# Patient Record
Sex: Female | Born: 1955 | Race: White | Hispanic: No | Marital: Married | Smoking: Never smoker
Health system: Southern US, Community
[De-identification: ages and names within clinical notes are randomized; demographics above are authoritative.]

## PROBLEM LIST (undated history)

## (undated) DIAGNOSIS — C801 Malignant (primary) neoplasm, unspecified: Secondary | ICD-10-CM

## (undated) DIAGNOSIS — Z9889 Other specified postprocedural states: Secondary | ICD-10-CM

## (undated) DIAGNOSIS — E039 Hypothyroidism, unspecified: Secondary | ICD-10-CM

## (undated) HISTORY — PX: BREAST EXCISIONAL BIOPSY: SUR124

## (undated) HISTORY — PX: BUNIONECTOMY: SHX129

---

## 1999-06-19 ENCOUNTER — Other Ambulatory Visit: Admission: RE | Admit: 1999-06-19 | Discharge: 1999-06-19 | Payer: Self-pay | Admitting: Obstetrics and Gynecology

## 1999-12-16 ENCOUNTER — Encounter: Payer: Self-pay | Admitting: Gastroenterology

## 1999-12-16 ENCOUNTER — Encounter: Admission: RE | Admit: 1999-12-16 | Discharge: 1999-12-16 | Payer: Self-pay | Admitting: Gastroenterology

## 2000-08-06 ENCOUNTER — Other Ambulatory Visit: Admission: RE | Admit: 2000-08-06 | Discharge: 2000-08-06 | Payer: Self-pay | Admitting: *Deleted

## 2000-12-24 ENCOUNTER — Encounter: Admission: RE | Admit: 2000-12-24 | Discharge: 2000-12-24 | Payer: Self-pay | Admitting: Gastroenterology

## 2000-12-24 ENCOUNTER — Encounter: Payer: Self-pay | Admitting: Gastroenterology

## 2001-08-19 ENCOUNTER — Other Ambulatory Visit: Admission: RE | Admit: 2001-08-19 | Discharge: 2001-08-19 | Payer: Self-pay | Admitting: Obstetrics and Gynecology

## 2002-01-27 ENCOUNTER — Encounter: Admission: RE | Admit: 2002-01-27 | Discharge: 2002-01-27 | Payer: Self-pay | Admitting: Gastroenterology

## 2002-01-27 ENCOUNTER — Encounter: Payer: Self-pay | Admitting: Gastroenterology

## 2002-10-04 ENCOUNTER — Other Ambulatory Visit: Admission: RE | Admit: 2002-10-04 | Discharge: 2002-10-04 | Payer: Self-pay | Admitting: Obstetrics and Gynecology

## 2003-01-29 ENCOUNTER — Encounter: Admission: RE | Admit: 2003-01-29 | Discharge: 2003-01-29 | Payer: Self-pay | Admitting: Gastroenterology

## 2003-01-29 ENCOUNTER — Encounter: Payer: Self-pay | Admitting: Gastroenterology

## 2003-10-09 ENCOUNTER — Other Ambulatory Visit: Admission: RE | Admit: 2003-10-09 | Discharge: 2003-10-09 | Payer: Self-pay | Admitting: Obstetrics and Gynecology

## 2004-02-12 ENCOUNTER — Encounter: Admission: RE | Admit: 2004-02-12 | Discharge: 2004-02-12 | Payer: Self-pay | Admitting: Gastroenterology

## 2005-03-17 ENCOUNTER — Encounter: Admission: RE | Admit: 2005-03-17 | Discharge: 2005-03-17 | Payer: Self-pay | Admitting: Obstetrics and Gynecology

## 2006-03-25 ENCOUNTER — Encounter: Admission: RE | Admit: 2006-03-25 | Discharge: 2006-03-25 | Payer: Self-pay | Admitting: Obstetrics and Gynecology

## 2007-04-19 ENCOUNTER — Encounter: Admission: RE | Admit: 2007-04-19 | Discharge: 2007-04-19 | Payer: Self-pay | Admitting: Obstetrics and Gynecology

## 2008-04-26 ENCOUNTER — Encounter: Admission: RE | Admit: 2008-04-26 | Discharge: 2008-04-26 | Payer: Self-pay | Admitting: Internal Medicine

## 2009-05-14 ENCOUNTER — Encounter: Admission: RE | Admit: 2009-05-14 | Discharge: 2009-05-14 | Payer: Self-pay | Admitting: Obstetrics and Gynecology

## 2010-06-24 ENCOUNTER — Encounter: Admission: RE | Admit: 2010-06-24 | Discharge: 2010-06-24 | Payer: Self-pay | Admitting: Obstetrics and Gynecology

## 2011-06-17 ENCOUNTER — Other Ambulatory Visit: Payer: Self-pay | Admitting: Obstetrics and Gynecology

## 2011-06-17 DIAGNOSIS — Z1231 Encounter for screening mammogram for malignant neoplasm of breast: Secondary | ICD-10-CM

## 2011-06-29 ENCOUNTER — Ambulatory Visit
Admission: RE | Admit: 2011-06-29 | Discharge: 2011-06-29 | Disposition: A | Discharge status: Home / Self Care | Payer: 59 | Source: Ambulatory Visit | Attending: Obstetrics and Gynecology | Admitting: Obstetrics and Gynecology

## 2011-06-29 DIAGNOSIS — Z1231 Encounter for screening mammogram for malignant neoplasm of breast: Secondary | ICD-10-CM

## 2011-11-25 ENCOUNTER — Ambulatory Visit (INDEPENDENT_AMBULATORY_CARE_PROVIDER_SITE_OTHER): Payer: 59 | Admitting: Sports Medicine

## 2011-11-25 DIAGNOSIS — M76829 Posterior tibial tendinitis, unspecified leg: Secondary | ICD-10-CM | POA: Insufficient documentation

## 2011-11-25 DIAGNOSIS — Q667 Congenital pes cavus, unspecified foot: Secondary | ICD-10-CM | POA: Insufficient documentation

## 2011-11-25 NOTE — Patient Instructions (Signed)
Great to see you. Continue physical therapy. Orthotics. Come back to see [place] in 6 weeks.

## 2011-11-25 NOTE — Assessment & Plan Note (Signed)
Continue physical therapy. Continue topical Lidoderm patches. Orthotics as above. Return to see [place] in 6 weeks.

## 2011-11-25 NOTE — Progress Notes (Signed)
  Subjective:    Patient ID: Karla Walker, female    DOB: [birth year], 56 y.o.   MRN: [id number]  HPI Karla Walker comes in with pain in her left ankle since summer of 2012. She has a diagnosis of posterior tibialis tendinitis. She has been through several sessions of formal physical therapy, and is about 20% improved. She plays a lot of tennis, and notes that her shoes are fairly unstable. She is desiring orthotics.  Past medical history: Hypothyroidism, bilateral metatarsal stress fractures. Medications: Synthroid. Past surgical history: Right foot bunionectomy. Family history: Positive for diabetes, hypertension, heart disease. Social history: Denies use of tobacco or drugs, uses alcohol occasionally. Allergies: None.  Review of Systems    No fevers, chills, night sweats, weight loss, chest pain, or shortness of breath.  Social History: Non-smoker. Objective:   Physical Exam General:  Well developed, well nourished, and in no acute distress. Neuro:  Alert and oriented x3, extra-ocular muscles intact. Skin: Warm and dry, no rashes noted. Respiratory:  Not using accessory muscles, speaking in full sentences. Musculoskeletal: Ankle: No visible erythema or swelling. Range of motion is full in all directions. Strength is 5/5 in all directions. Stable lateral and medial ligaments; squeeze test and kleiger test unremarkable; Talar dome nontender; No pain at base of 5th MT; No tenderness over cuboid; No tenderness over N spot or navicular prominence No tenderness on posterior aspects of lateral and medial malleolus No sign of peroneal tendon subluxations or tenderness to palpation Negative tarsal tunnel tinel's Able to walk 4 steps.  Pes planus, hallux valgus with bunion, bunionette, no abnormal callus. First ray insufficiency bilaterally.  Patient was fitted for a standard, cushioned, semi-rigid orthotic. The orthotic was heated and afterward the patient stood on the orthotic blank  positioned on the orthotic stand. The patient was positioned in subtalar neutral position and 10 degrees of ankle dorsiflexion in a weight bearing stance. After completion of molding, a stable base was applied to the orthotic blank. The blank was ground to a stable position for weight bearing. Size: 8 Base: Blue EVA Posting and Padding: None  The patient ambulated and was comfortable.   Assessment & Plan:

## 2011-11-25 NOTE — Assessment & Plan Note (Signed)
Orthotics as above. 

## 2012-01-06 ENCOUNTER — Ambulatory Visit (INDEPENDENT_AMBULATORY_CARE_PROVIDER_SITE_OTHER): Payer: 59 | Admitting: Sports Medicine

## 2012-01-06 VITALS — BP 138/90

## 2012-01-06 DIAGNOSIS — M76829 Posterior tibial tendinitis, unspecified leg: Secondary | ICD-10-CM

## 2012-01-06 NOTE — Progress Notes (Signed)
  Subjective:    Patient ID: Karla Walker, female    DOB: Mar 08, 1956, 56 y.o.   MRN: [id number]  HPI Karla Walker comes back for followup of her tibialis posterior tendinopathy in her left ankle.she's been through 6 weeks of physical therapy, and we made her some orthotics. Overall she was significantly better, however she has been out of her orthotics for a short period of time. The pain is starting to return, and she would like another pair orthotics.   Review of Systems    No fevers, chills, night sweats, weight loss, chest pain, or shortness of breath.  Social History: Non-smoker. Objective:   Physical Exam General:  Well developed, well nourished, and in no acute distress. Neuro:  Alert and oriented x3, extra-ocular muscles intact. Skin: Warm and dry, no rashes noted. Respiratory:  Not using accessory muscles, speaking in full sentences. Musculoskeletal: Left Ankle: No visible erythema or swelling. Range of motion is full in all directions. Strength is 5/5 in all directions. Stable lateral and medial ligaments; squeeze test and kleiger test unremarkable; Talar dome nontender; No pain at base of 5th MT; No tenderness over cuboid; No tenderness over N spot or navicular prominence No tenderness on posterior aspects of lateral and medial malleolus No sign of peroneal tendon subluxations or tenderness to palpation Negative tarsal tunnel tinel's Able to walk 4 steps.  Pes planus, hallux valgus with bunion, bunionette, no abnormal callus. First ray insufficiency bilaterally.  Patient was fitted for a standard, cushioned, semi-rigid orthotic. The orthotic was heated and afterward the patient stood on the orthotic blank positioned on the orthotic stand. The patient was positioned in subtalar neutral position and 10 degrees of ankle dorsiflexion in a weight bearing stance. After completion of molding, a stable base was applied to the orthotic blank. The blank was ground to a stable position  for weight bearing. Size: 8 Base: Blue EVA Posting and Padding: None  The patient ambulated and was comfortable.      Assessment & Plan:

## 2012-01-06 NOTE — Assessment & Plan Note (Signed)
Orthotics as above. Home rehabilitation exercises. Also provided her with several scaphoid pads to use in her flats. We will see her back in about 4-6 weeks to see her she's doing.

## 2012-06-10 ENCOUNTER — Other Ambulatory Visit: Payer: Self-pay | Admitting: Obstetrics and Gynecology

## 2012-06-10 DIAGNOSIS — Z1231 Encounter for screening mammogram for malignant neoplasm of breast: Secondary | ICD-10-CM

## 2012-06-29 ENCOUNTER — Ambulatory Visit
Admission: RE | Admit: 2012-06-29 | Discharge: 2012-06-29 | Disposition: A | Discharge status: Home / Self Care | Payer: 59 | Source: Ambulatory Visit | Attending: Obstetrics and Gynecology | Admitting: Obstetrics and Gynecology

## 2012-06-29 DIAGNOSIS — Z1231 Encounter for screening mammogram for malignant neoplasm of breast: Secondary | ICD-10-CM

## 2013-07-03 ENCOUNTER — Other Ambulatory Visit: Payer: Self-pay

## 2013-07-03 DIAGNOSIS — Z1231 Encounter for screening mammogram for malignant neoplasm of breast: Secondary | ICD-10-CM

## 2013-07-26 ENCOUNTER — Ambulatory Visit
Admission: RE | Admit: 2013-07-26 | Discharge: 2013-07-26 | Disposition: A | Discharge status: Home / Self Care | Payer: 59 | Source: Ambulatory Visit

## 2013-07-26 DIAGNOSIS — Z1231 Encounter for screening mammogram for malignant neoplasm of breast: Secondary | ICD-10-CM

## 2014-07-12 ENCOUNTER — Other Ambulatory Visit: Payer: Self-pay

## 2014-07-12 DIAGNOSIS — Z1239 Encounter for other screening for malignant neoplasm of breast: Secondary | ICD-10-CM

## 2014-08-06 ENCOUNTER — Other Ambulatory Visit: Payer: Self-pay

## 2014-08-06 ENCOUNTER — Ambulatory Visit
Admission: RE | Admit: 2014-08-06 | Discharge: 2014-08-06 | Disposition: A | Discharge status: Home / Self Care | Payer: 59 | Source: Ambulatory Visit

## 2014-08-06 DIAGNOSIS — Z1231 Encounter for screening mammogram for malignant neoplasm of breast: Secondary | ICD-10-CM

## 2015-07-25 ENCOUNTER — Other Ambulatory Visit: Payer: Self-pay

## 2015-07-25 DIAGNOSIS — Z1231 Encounter for screening mammogram for malignant neoplasm of breast: Secondary | ICD-10-CM

## 2015-08-19 ENCOUNTER — Ambulatory Visit: Payer: Self-pay

## 2015-09-17 ENCOUNTER — Ambulatory Visit
Admission: RE | Admit: 2015-09-17 | Discharge: 2015-09-17 | Disposition: A | Discharge status: Home / Self Care | Payer: 59 | Source: Ambulatory Visit

## 2015-09-17 DIAGNOSIS — Z1231 Encounter for screening mammogram for malignant neoplasm of breast: Secondary | ICD-10-CM

## 2016-07-28 ENCOUNTER — Other Ambulatory Visit: Payer: Self-pay | Admitting: Internal Medicine

## 2016-07-28 DIAGNOSIS — Z1231 Encounter for screening mammogram for malignant neoplasm of breast: Secondary | ICD-10-CM

## 2016-09-17 ENCOUNTER — Other Ambulatory Visit: Payer: Self-pay | Admitting: Gastroenterology

## 2016-09-18 ENCOUNTER — Ambulatory Visit
Admission: RE | Admit: 2016-09-18 | Discharge: 2016-09-18 | Disposition: A | Discharge status: Home / Self Care | Payer: 59 | Source: Ambulatory Visit | Attending: Internal Medicine | Admitting: Internal Medicine

## 2016-09-18 DIAGNOSIS — Z1231 Encounter for screening mammogram for malignant neoplasm of breast: Secondary | ICD-10-CM

## 2016-09-22 ENCOUNTER — Other Ambulatory Visit: Payer: Self-pay | Admitting: Internal Medicine

## 2016-09-22 DIAGNOSIS — R928 Other abnormal and inconclusive findings on diagnostic imaging of breast: Secondary | ICD-10-CM

## 2016-09-29 ENCOUNTER — Ambulatory Visit
Admission: RE | Admit: 2016-09-29 | Discharge: 2016-09-29 | Disposition: A | Discharge status: Home / Self Care | Payer: 59 | Source: Ambulatory Visit | Attending: Internal Medicine | Admitting: Internal Medicine

## 2016-09-29 DIAGNOSIS — R928 Other abnormal and inconclusive findings on diagnostic imaging of breast: Secondary | ICD-10-CM

## 2016-11-30 ENCOUNTER — Encounter (HOSPITAL_COMMUNITY): Payer: Self-pay | Admitting: *Deleted

## 2016-11-30 ENCOUNTER — Ambulatory Visit (HOSPITAL_COMMUNITY): Payer: 59 | Admitting: Anesthesiology

## 2016-11-30 ENCOUNTER — Ambulatory Visit (HOSPITAL_COMMUNITY)
Admission: RE | Admit: 2016-11-30 | Discharge: 2016-11-30 | Disposition: A | Discharge status: Home / Self Care | Payer: 59 | Source: Ambulatory Visit | Attending: Gastroenterology | Admitting: Gastroenterology

## 2016-11-30 ENCOUNTER — Encounter (HOSPITAL_COMMUNITY)
Admission: RE | Disposition: A | Discharge status: Home / Self Care | Payer: Self-pay | Source: Ambulatory Visit | Attending: Gastroenterology

## 2016-11-30 DIAGNOSIS — E039 Hypothyroidism, unspecified: Secondary | ICD-10-CM | POA: Diagnosis not present

## 2016-11-30 DIAGNOSIS — D125 Benign neoplasm of sigmoid colon: Secondary | ICD-10-CM | POA: Insufficient documentation

## 2016-11-30 DIAGNOSIS — Z1211 Encounter for screening for malignant neoplasm of colon: Secondary | ICD-10-CM | POA: Diagnosis present

## 2016-11-30 DIAGNOSIS — Z8551 Personal history of malignant neoplasm of bladder: Secondary | ICD-10-CM | POA: Diagnosis not present

## 2016-11-30 DIAGNOSIS — Z86010 Personal history of colonic polyps: Secondary | ICD-10-CM | POA: Insufficient documentation

## 2016-11-30 DIAGNOSIS — M479 Spondylosis, unspecified: Secondary | ICD-10-CM | POA: Diagnosis not present

## 2016-11-30 DIAGNOSIS — Z85820 Personal history of malignant melanoma of skin: Secondary | ICD-10-CM | POA: Diagnosis not present

## 2016-11-30 DIAGNOSIS — I10 Essential (primary) hypertension: Secondary | ICD-10-CM | POA: Insufficient documentation

## 2016-11-30 HISTORY — PX: COLONOSCOPY WITH PROPOFOL: SHX5780

## 2016-11-30 SURGERY — COLONOSCOPY WITH PROPOFOL
Anesthesia: Monitor Anesthesia Care

## 2016-11-30 MED ORDER — LIDOCAINE 2% (20 MG/ML) 5 ML SYRINGE
INTRAMUSCULAR | Status: AC
  Filled 2016-11-30: qty 5

## 2016-11-30 MED ORDER — PROPOFOL 10 MG/ML IV BOLUS
INTRAVENOUS | Status: AC
Start: 2016-11-30 — End: 2016-11-30
  Filled 2016-11-30: qty 40

## 2016-11-30 MED ORDER — PROPOFOL 10 MG/ML IV BOLUS
INTRAVENOUS | Status: AC
  Filled 2016-11-30: qty 20

## 2016-11-30 MED ORDER — PROPOFOL 10 MG/ML IV BOLUS
INTRAVENOUS | Status: DC | PRN
  Administered 2016-11-30: 50 mg via INTRAVENOUS
  Administered 2016-11-30 (×4): 20 mg via INTRAVENOUS
  Administered 2016-11-30: 50 mg via INTRAVENOUS
  Administered 2016-11-30: 100 mg via INTRAVENOUS
  Administered 2016-11-30 (×3): 20 mg via INTRAVENOUS
  Administered 2016-11-30 (×2): 50 mg via INTRAVENOUS

## 2016-11-30 MED ORDER — SODIUM CHLORIDE 0.9 % IV SOLN: INTRAVENOUS | Status: DC

## 2016-11-30 MED ORDER — LIDOCAINE HCL 1 % IJ SOLN
INTRAMUSCULAR | Status: DC | PRN
  Administered 2016-11-30: 20 mg via INTRADERMAL

## 2016-11-30 MED ORDER — LACTATED RINGERS IV SOLN
INTRAVENOUS | Status: DC
  Administered 2016-11-30: 10:00:00 via INTRAVENOUS

## 2016-11-30 SURGICAL SUPPLY — 22 items

## 2016-11-30 NOTE — Anesthesia Preprocedure Evaluation (Signed)
Anesthesia Evaluation  Patient identified by MRN, date of birth, ID band Patient awake    Reviewed: Allergy & Precautions, NPO status , Patient's Chart, lab work & pertinent test results  Airway Mallampati: II  TM Distance: >3 FB Neck ROM: Full    Dental no notable dental hx.    Pulmonary neg pulmonary ROS,    Pulmonary exam normal breath sounds clear to auscultation       Cardiovascular negative cardio ROS Normal cardiovascular exam Rhythm:Regular Rate:Normal     Neuro/Psych negative neurological ROS  negative psych ROS   GI/Hepatic negative GI ROS, Neg liver ROS,   Endo/Other  negative endocrine ROS  Renal/GU negative Renal ROS  negative genitourinary   Musculoskeletal negative musculoskeletal ROS (+)   Abdominal   Peds negative pediatric ROS (+)  Hematology negative hematology ROS (+)   Anesthesia Other Findings   Reproductive/Obstetrics negative OB ROS                             Anesthesia Physical Anesthesia Plan  ASA: II  Anesthesia Plan: MAC   Post-op Pain Management:    Induction: Intravenous  Airway Management Planned: Simple Face Mask  Additional Equipment:   Intra-op Plan:   Post-operative Plan:   Informed Consent: I have reviewed the patients History and Physical, chart, labs and discussed the procedure including the risks, benefits and alternatives for the proposed anesthesia with the patient or authorized representative who has indicated his/her understanding and acceptance.   Dental advisory given  Plan Discussed with: CRNA and Surgeon  Anesthesia Plan Comments:         Anesthesia Quick Evaluation

## 2016-11-30 NOTE — Op Note (Signed)
[facility] Patient Name: Karla Walker Procedure Date: 11/30/2016 MRN: [id number] Attending MD: Garlan Fair , MD Date of Birth: 08/07/1956 CSN: [id number] Age: 61 Admit Type: Outpatient Procedure:                Colonoscopy Indications:              High risk colon cancer surveillance: 08/15/2008                            colonoscopy was performed with removal of a 5 mm                            tubular adenomatous sigmoid colon polyp. 10/01/2011                            normal surveillance colonoscopy was performed. Providers:                Garlan Fair, MD, Cleda Daub, RN, William Dalton, Technician Referring MD:              Medicines:                Propofol per Anesthesia Complications:            No immediate complications. Estimated Blood Loss:     Estimated blood loss: none. Procedure:                Pre-Anesthesia Assessment:                           - Prior to the procedure, a History and Physical                            was performed, and patient medications and                            allergies were reviewed. The patient's tolerance of                            previous anesthesia was also reviewed. The risks                            and benefits of the procedure and the sedation                            options and risks were discussed with the patient.                            All questions were answered, and informed consent                            was obtained. Prior Anticoagulants: The patient has  taken no previous anticoagulant or antiplatelet                            agents. ASA Grade Assessment: II - A patient with                            mild systemic disease. After reviewing the risks                            and benefits, the patient was deemed in                            satisfactory condition to undergo the procedure.                            After obtaining informed consent, the colonoscope                            was passed under direct vision. Throughout the                            procedure, the patient's blood pressure, pulse, and                            oxygen saturations were monitored continuously. The                            [id number]) scope was introduced through                            the anus and advanced to the the cecum, identified                            by appendiceal orifice and ileocecal valve. The                            colonoscopy was performed without difficulty. The                            patient tolerated the procedure well. The quality                            of the bowel preparation was good. The terminal                            ileum, the ileocecal valve, the appendiceal orifice                            and the rectum were photographed. Scope In: 10:49:41 AM Scope Out: 11:01:48 AM Scope Withdrawal Time: 0 hours 5 minutes 45 seconds  Total Procedure Duration: 0 hours 12 minutes 7 seconds  Findings:      The perianal and digital rectal examinations were normal.      The entire examined colon appeared  normal. Impression:               - The entire examined colon is normal.                           - No specimens collected. Moderate Sedation:      N/A- Per Anesthesia Care Recommendation:           - Patient has a contact number available for                            emergencies. The signs and symptoms of potential                            delayed complications were discussed with the                            patient. Return to normal activities tomorrow.                            Written discharge instructions were provided to the                            patient.                           - Repeat colonoscopy in 10 years for surveillance.                           - Resume previous diet.                           - Continue present  medications. Procedure Code(s):        --- Professional ---                           [id number], Colorectal cancer screening; colonoscopy on                            individual at high risk Diagnosis Code(s):        --- Professional ---                           Z86.010, Personal history of colonic polyps CPT copyright 2016 American Medical Association. All rights reserved. The codes documented in this report are preliminary and upon coder review may  be revised to meet current compliance requirements. Earle Gell, MD Garlan Fair, MD 11/30/2016 11:07:14 AM This report has been signed electronically. Number of Addenda: 0

## 2016-11-30 NOTE — H&P (Signed)
Procedure: Surveillance colonoscopy. 10/01/2011 normal surveillance colonoscopy was performed. 08/15/2008 colonoscopy was performed with removal of a 5 mm tubular adenomatous sigmoid colon polyp  History: The patient is a 61 year old female born Aug 25, 1956. She is scheduled to undergo a repeat surveillance colonoscopy today.  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 Bladder cancer surgery. Hypothyroidism. Melanoma removed in 1996. Hypertension. Osteoarthritis of the cervical spine.  Exam: The patient is alert and lying comfortably on the endoscopy stretcher. Abdomen is soft and nontender to palpation. Cardiac exam reveals a regular rhythm. Lungs clear to auscultation.  Plan: Proceed with surveillance colonoscopy

## 2016-11-30 NOTE — Anesthesia Postprocedure Evaluation (Signed)
Anesthesia Post Note  Patient: Karla Walker  Procedure(s) Performed: Procedure(s) (LRB): COLONOSCOPY WITH PROPOFOL (N/A)  Patient location during evaluation: PACU Anesthesia Type: MAC Level of consciousness: awake and alert Pain management: pain level controlled Vital Signs Assessment: post-procedure vital signs reviewed and stable Respiratory status: spontaneous breathing, nonlabored ventilation, respiratory function stable and patient connected to nasal cannula oxygen Cardiovascular status: stable and blood pressure returned to baseline Anesthetic complications: no       Last Vitals:  Vitals:   11/30/16 1120 11/30/16 1125  BP: 139/79 136/80  Pulse: 67 73  Resp: 10 16  Temp:      Last Pain:  Vitals:   11/30/16 0945  TempSrc: Oral                 [name] S

## 2016-11-30 NOTE — Transfer of Care (Signed)
Immediate Anesthesia Transfer of Care Note  Patient: EVONNE RINKS  Procedure(s) Performed: Procedure(s): COLONOSCOPY WITH PROPOFOL (N/A)  Patient Location: PACU and Endoscopy Unit  Anesthesia Type:MAC  Level of Consciousness: awake, alert , oriented and patient cooperative  Airway & Oxygen Therapy: Patient Spontanous Breathing and Patient connected to nasal cannula oxygen  Post-op Assessment: Report given to RN and Post -op Vital signs reviewed and stable  Post vital signs: Reviewed and stable  Last Vitals:  Vitals:   11/30/16 0945  BP: (!) 143/78  Pulse: 65  Resp: 14  Temp: 36.9 C    Last Pain:  Vitals:   11/30/16 0945  TempSrc: Oral         Complications: No apparent anesthesia complications

## 2016-11-30 NOTE — Discharge Instructions (Signed)

## 2016-12-02 ENCOUNTER — Encounter (HOSPITAL_COMMUNITY): Payer: Self-pay | Admitting: Gastroenterology

## 2016-12-16 ENCOUNTER — Other Ambulatory Visit: Payer: Self-pay | Admitting: Internal Medicine

## 2016-12-16 DIAGNOSIS — G4452 New daily persistent headache (NDPH): Secondary | ICD-10-CM

## 2016-12-17 ENCOUNTER — Ambulatory Visit
Admission: RE | Admit: 2016-12-17 | Discharge: 2016-12-17 | Disposition: A | Discharge status: Home / Self Care | Payer: 59 | Source: Ambulatory Visit | Attending: Internal Medicine | Admitting: Internal Medicine

## 2016-12-17 DIAGNOSIS — G4452 New daily persistent headache (NDPH): Secondary | ICD-10-CM

## 2017-03-08 NOTE — Addendum Note (Signed)
Addendum  created 03/08/17 1132 by Myrtie Soman, MD   Sign clinical note

## 2017-03-08 NOTE — Anesthesia Postprocedure Evaluation (Signed)
Anesthesia Post Note  Patient: Karla Walker  Procedure(s) Performed: Procedure(s) (LRB): COLONOSCOPY WITH PROPOFOL (N/A)     Anesthesia Post Evaluation  Last Vitals:  Vitals:   11/30/16 1120 11/30/16 1125  BP: 139/79 136/80  Pulse: 67 73  Resp: 10 16  Temp:      Last Pain:  Vitals:   11/30/16 0945  TempSrc: Oral                 [name]

## 2017-11-23 ENCOUNTER — Other Ambulatory Visit: Payer: Self-pay | Admitting: Internal Medicine

## 2017-11-23 DIAGNOSIS — Z1231 Encounter for screening mammogram for malignant neoplasm of breast: Secondary | ICD-10-CM

## 2017-12-15 ENCOUNTER — Ambulatory Visit
Admission: RE | Admit: 2017-12-15 | Discharge: 2017-12-15 | Disposition: A | Discharge status: Home / Self Care | Payer: 59 | Source: Ambulatory Visit | Attending: Internal Medicine | Admitting: Internal Medicine

## 2017-12-15 DIAGNOSIS — Z1231 Encounter for screening mammogram for malignant neoplasm of breast: Secondary | ICD-10-CM

## 2018-07-13 ENCOUNTER — Other Ambulatory Visit: Payer: Self-pay | Admitting: Internal Medicine

## 2018-07-13 DIAGNOSIS — Z0000 Encounter for general adult medical examination without abnormal findings: Secondary | ICD-10-CM

## 2018-07-14 ENCOUNTER — Other Ambulatory Visit: Payer: Self-pay | Admitting: Internal Medicine

## 2018-07-14 DIAGNOSIS — E785 Hyperlipidemia, unspecified: Secondary | ICD-10-CM

## 2018-07-19 ENCOUNTER — Other Ambulatory Visit: Payer: Self-pay | Admitting: Cardiovascular Disease

## 2018-07-19 ENCOUNTER — Inpatient Hospital Stay
Admission: RE | Admit: 2018-07-19 | Discharge: 2018-07-19 | Disposition: A | Discharge status: Home / Self Care | Payer: 59 | Source: Ambulatory Visit | Attending: Internal Medicine | Admitting: Internal Medicine

## 2018-07-20 ENCOUNTER — Other Ambulatory Visit: Payer: Self-pay | Admitting: Internal Medicine

## 2018-07-20 DIAGNOSIS — E7800 Pure hypercholesterolemia, unspecified: Secondary | ICD-10-CM

## 2018-07-20 DIAGNOSIS — Z0000 Encounter for general adult medical examination without abnormal findings: Secondary | ICD-10-CM

## 2018-07-25 ENCOUNTER — Ambulatory Visit
Admission: RE | Admit: 2018-07-25 | Discharge: 2018-07-25 | Disposition: A | Discharge status: Home / Self Care | Payer: 59 | Source: Ambulatory Visit | Attending: Internal Medicine | Admitting: Internal Medicine

## 2018-07-25 DIAGNOSIS — E7800 Pure hypercholesterolemia, unspecified: Secondary | ICD-10-CM

## 2018-07-25 DIAGNOSIS — Z0000 Encounter for general adult medical examination without abnormal findings: Secondary | ICD-10-CM

## 2018-12-12 ENCOUNTER — Other Ambulatory Visit: Payer: Self-pay | Admitting: Pediatric Surgery

## 2018-12-12 DIAGNOSIS — Z1231 Encounter for screening mammogram for malignant neoplasm of breast: Secondary | ICD-10-CM

## 2019-01-23 ENCOUNTER — Ambulatory Visit: Payer: Self-pay

## 2019-03-07 ENCOUNTER — Ambulatory Visit
Admission: RE | Admit: 2019-03-07 | Discharge: 2019-03-07 | Disposition: A | Discharge status: Home / Self Care | Payer: 59 | Source: Ambulatory Visit | Attending: Pediatric Surgery | Admitting: Pediatric Surgery

## 2019-03-07 ENCOUNTER — Ambulatory Visit: Payer: Self-pay

## 2019-03-07 ENCOUNTER — Other Ambulatory Visit: Payer: Self-pay

## 2019-03-07 DIAGNOSIS — Z1231 Encounter for screening mammogram for malignant neoplasm of breast: Secondary | ICD-10-CM

## 2020-05-27 ENCOUNTER — Other Ambulatory Visit: Payer: Self-pay | Admitting: Internal Medicine

## 2020-05-27 DIAGNOSIS — Z1231 Encounter for screening mammogram for malignant neoplasm of breast: Secondary | ICD-10-CM

## 2020-06-05 ENCOUNTER — Other Ambulatory Visit: Payer: Self-pay

## 2020-06-05 ENCOUNTER — Ambulatory Visit
Admission: RE | Admit: 2020-06-05 | Discharge: 2020-06-05 | Disposition: A | Discharge status: Home / Self Care | Payer: 59 | Source: Ambulatory Visit | Attending: Internal Medicine | Admitting: Internal Medicine

## 2020-06-05 DIAGNOSIS — Z1231 Encounter for screening mammogram for malignant neoplasm of breast: Secondary | ICD-10-CM

## 2021-01-15 ENCOUNTER — Other Ambulatory Visit: Payer: Self-pay | Admitting: Internal Medicine

## 2021-01-15 DIAGNOSIS — Z1231 Encounter for screening mammogram for malignant neoplasm of breast: Secondary | ICD-10-CM

## 2021-06-18 ENCOUNTER — Other Ambulatory Visit: Payer: Self-pay

## 2021-06-18 ENCOUNTER — Ambulatory Visit
Admission: RE | Admit: 2021-06-18 | Discharge: 2021-06-18 | Disposition: A | Discharge status: Home / Self Care | Payer: 59 | Source: Ambulatory Visit | Attending: Internal Medicine | Admitting: Internal Medicine

## 2021-06-18 DIAGNOSIS — Z1231 Encounter for screening mammogram for malignant neoplasm of breast: Secondary | ICD-10-CM

## 2022-07-13 ENCOUNTER — Other Ambulatory Visit: Payer: Self-pay | Admitting: Internal Medicine

## 2022-07-13 DIAGNOSIS — Z1231 Encounter for screening mammogram for malignant neoplasm of breast: Secondary | ICD-10-CM

## 2022-08-25 ENCOUNTER — Ambulatory Visit
Admission: RE | Admit: 2022-08-25 | Discharge: 2022-08-25 | Disposition: A | Discharge status: Home / Self Care | Payer: 59 | Source: Ambulatory Visit | Attending: Internal Medicine | Admitting: Internal Medicine

## 2022-08-25 DIAGNOSIS — Z1231 Encounter for screening mammogram for malignant neoplasm of breast: Secondary | ICD-10-CM

## 2023-05-08 IMAGING — MG MM DIGITAL SCREENING BILAT W/ TOMO AND CAD
8 series · 9 of 24 positions shown · non-contrast
Comparison: Previous exam(s).

CLINICAL DATA: Screening.

EXAM:
DIGITAL SCREENING BILATERAL MAMMOGRAM WITH TOMOSYNTHESIS AND CAD
TECHNIQUE: Bilateral screening digital craniocaudal and mediolateral oblique
mammograms were obtained. Bilateral screening digital breast
tomosynthesis was performed. The images were evaluated with
computer-aided detection.

[L CC synth-2D]
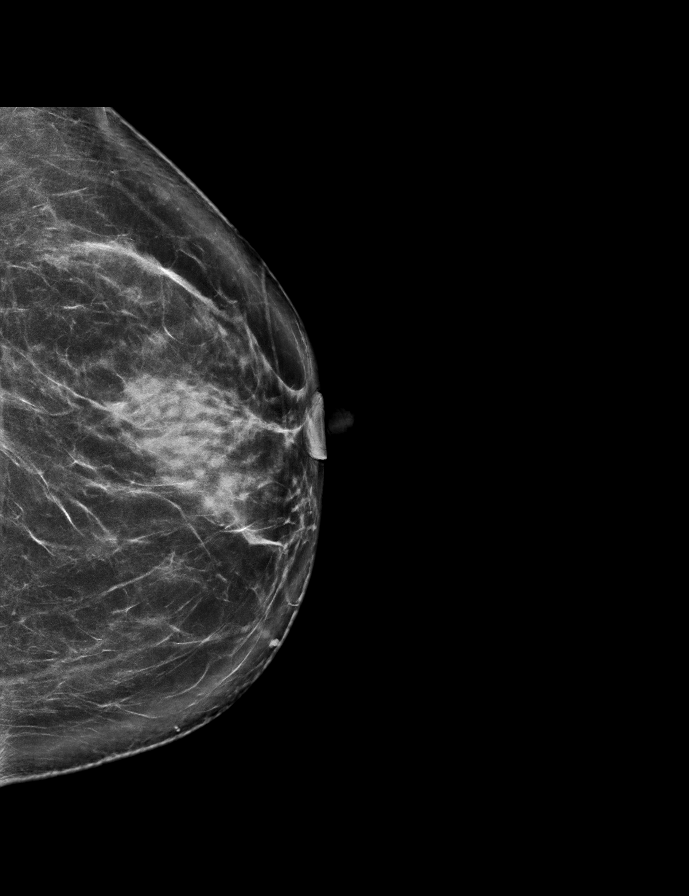

[R MLO synth-2D]
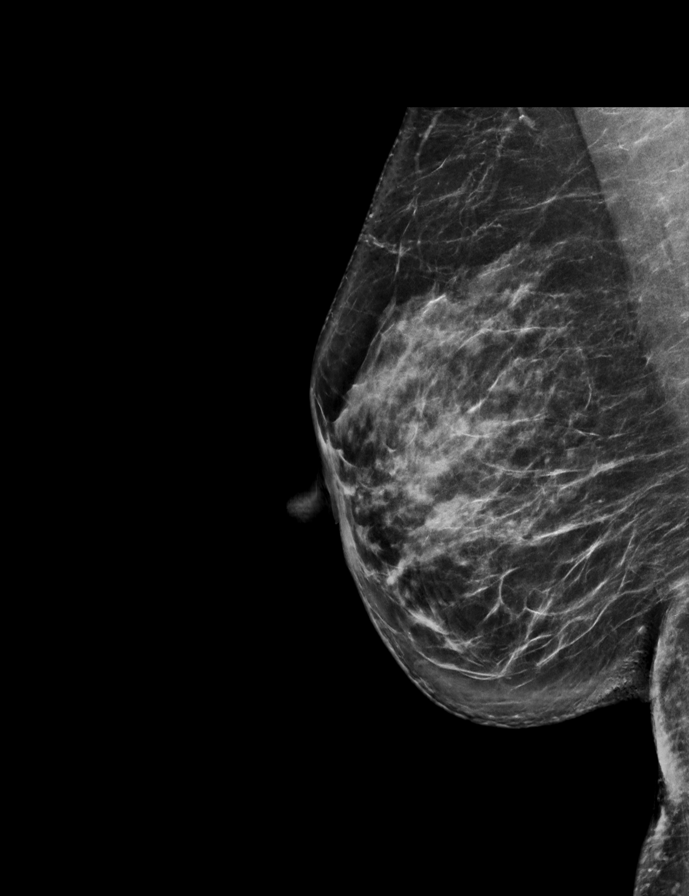

[R CC synth-2D]
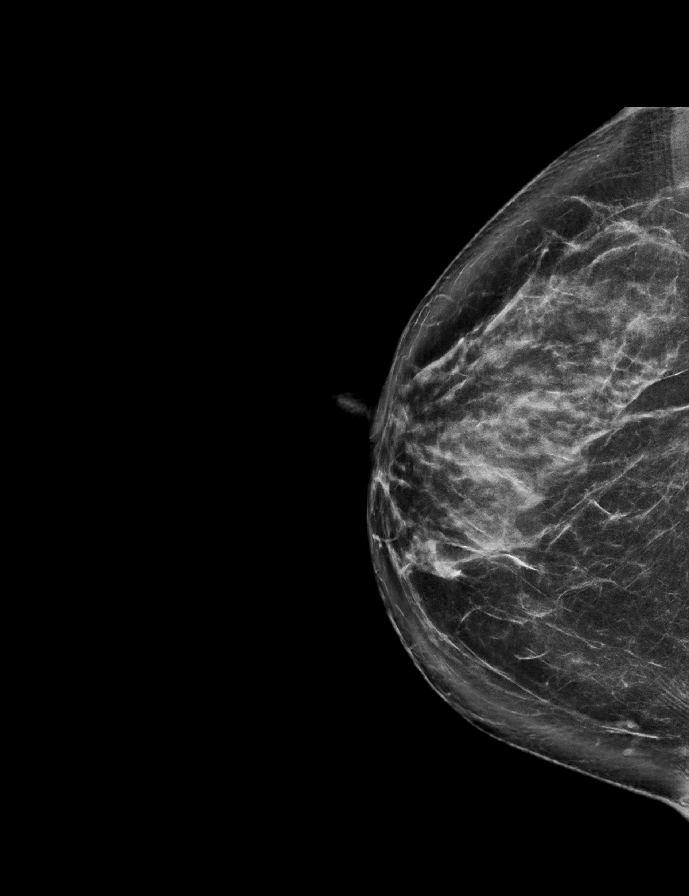

[L MLO synth-2D]
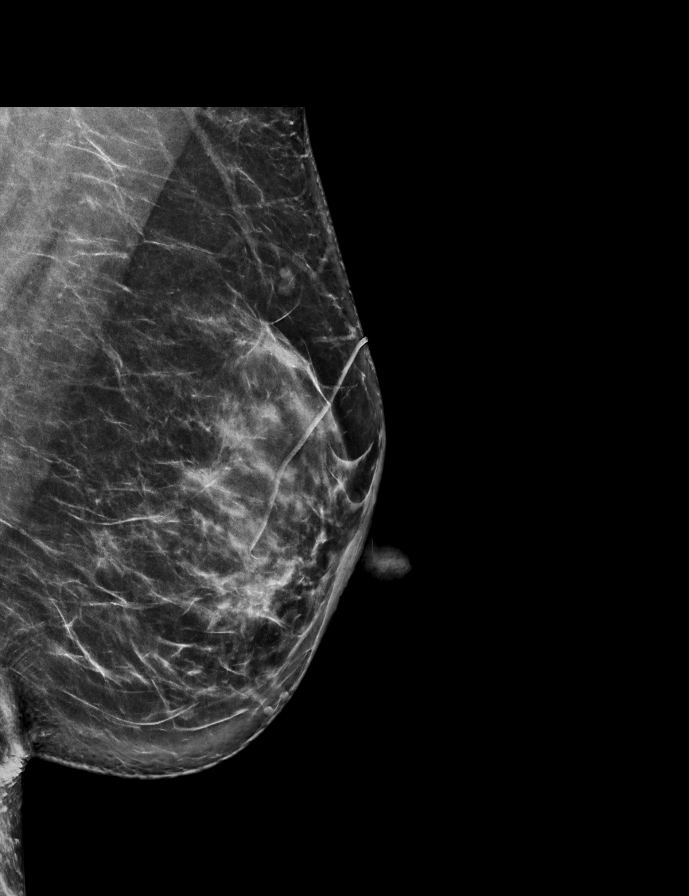

[L CC tomo · 2 of 86 frames shown]
[frame 28/86]
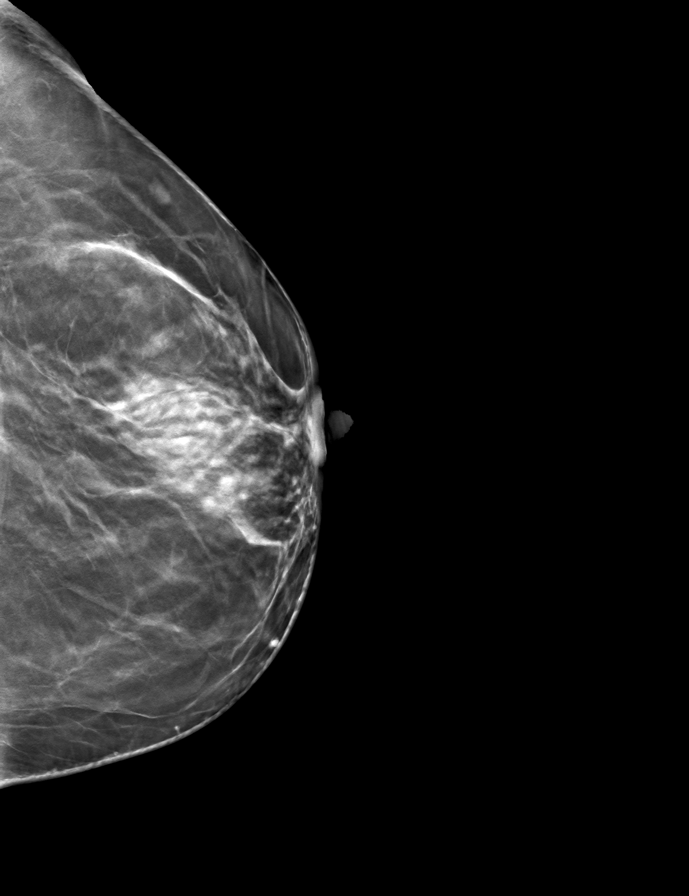
[frame 43/86]
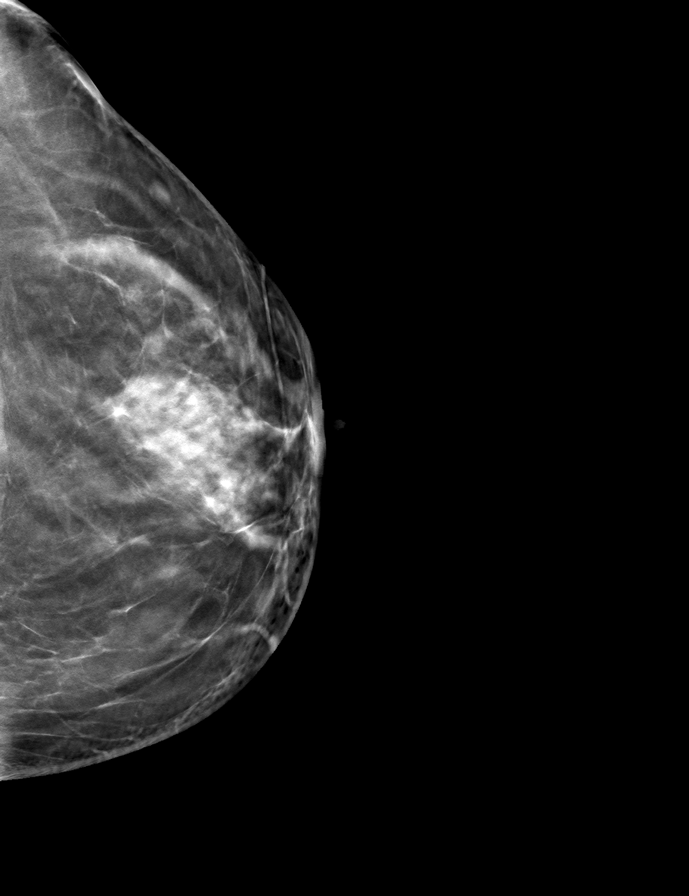

[R MLO tomo · tomo slice 41/81.0]
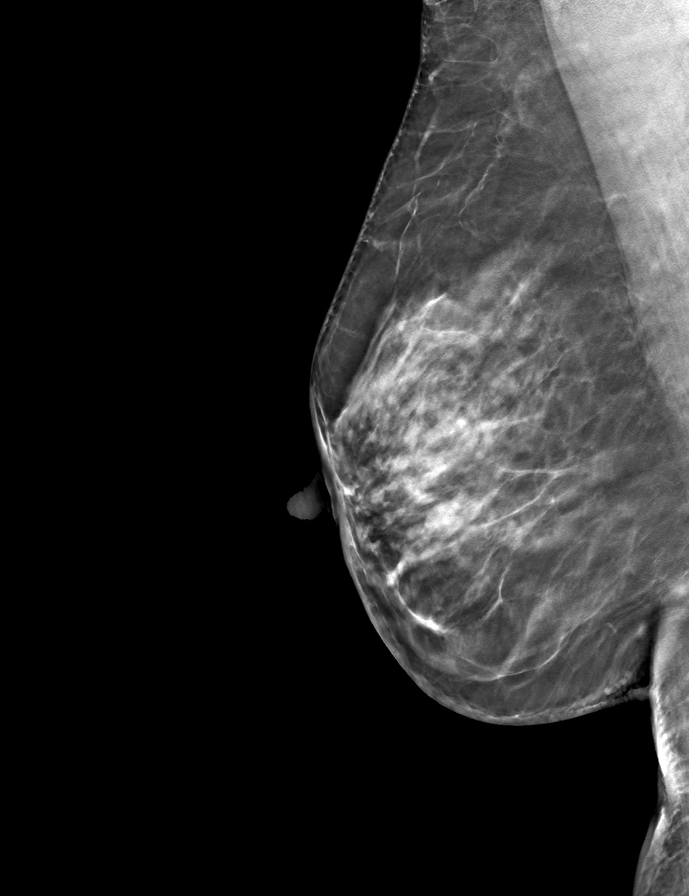

[L MLO tomo · tomo slice 41/80.0]
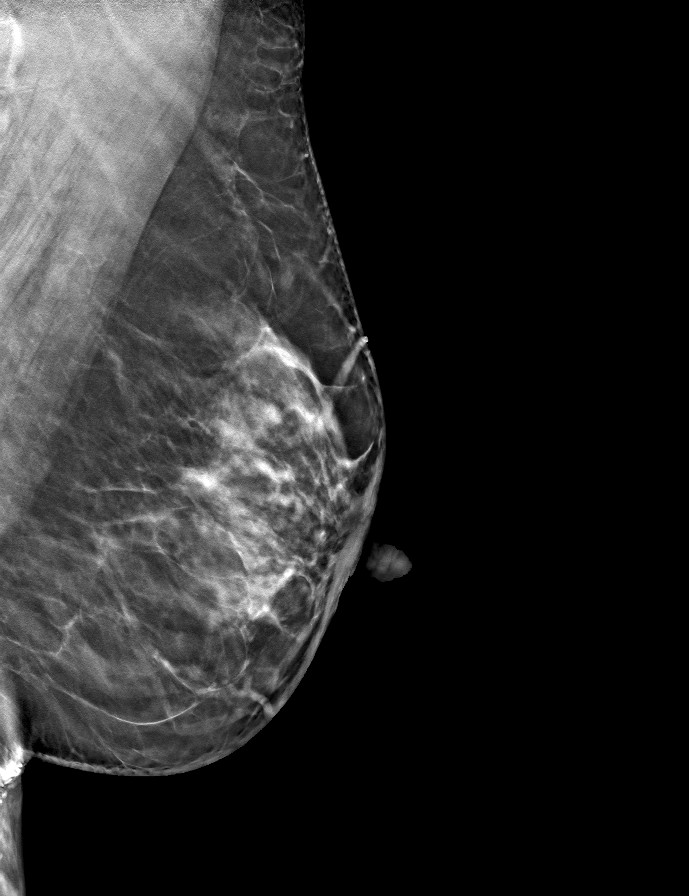

[R CC tomo · tomo slice 41/82.0]
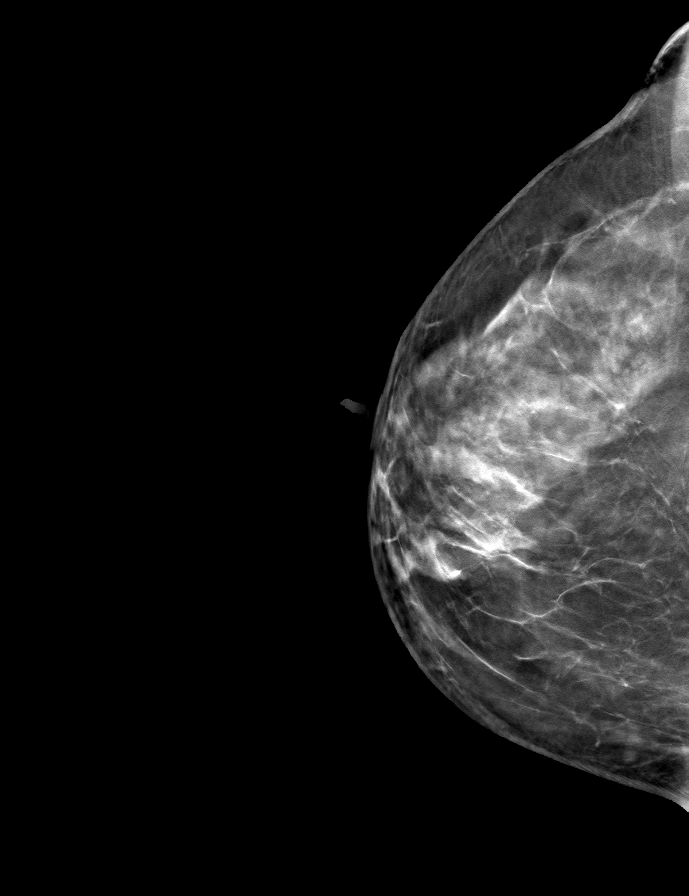

[9 of 24 positions shown; findings below may reference images not displayed]

ACR Breast Density Category c: The breast tissue is heterogeneously
dense, which may obscure small masses.
FINDINGS: There are no findings suspicious for malignancy.
IMPRESSION: No mammographic evidence of malignancy. A result letter of this
screening mammogram will be mailed directly to the patient.

RECOMMENDATION:
Screening mammogram in one year. (Code:[id number])

BI-RADS CATEGORY  1: Negative.

## 2023-08-11 NOTE — Progress Notes (Signed)
Sent message, via epic in basket, requesting orders in epic from surgeon.  

## 2023-08-16 NOTE — Progress Notes (Signed)
Anesthesia Review:  PCP: Cardiologist : Chest x-ray : EKG : 08/18/23  Echo : Stress test: Cardiac Cath :  Activity level:  Sleep Study/ CPAP : Fasting Blood Sugar :      / Checks Blood Sugar -- times a day:   Blood Thinner/ Instructions /Last Dose: ASA / Instructions/ Last Dose :    DM- type-  Metformin- none day o fsurgery  Hgba1c-08/18/23-

## 2023-08-16 NOTE — Patient Instructions (Signed)
SURGICAL WAITING ROOM VISITATION  Patients having surgery or a procedure may have no more than 2 support people in the waiting area - these visitors may rotate.    Children under the age of 27 must have an adult with them who is not the patient.  Due to an increase in RSV and influenza rates and associated hospitalizations, children ages 44 and under may not visit patients in [facility] hospitals.  If the patient needs to stay at the hospital during part of their recovery, the visitor guidelines for inpatient rooms apply. Pre-op nurse will coordinate an appropriate time for 1 support person to accompany patient in pre-op.  This support person may not rotate.    Please refer to the [facility] website for the visitor guidelines for Inpatients (after your surgery is over and you are in a regular room).       Your procedure is scheduled on:  08/30/23    Report to [facility] Main Entrance    Report to admitting at  0800 AM   Call this number if you have problems the morning of surgery [phone]   Do not eat food :After Midnight.   After Midnight you may have the following liquids until _ 0700_____ AM DAY OF SURGERY  Water Non-Citrus Juices (without pulp, NO RED-Apple, White grape, White cranberry) Black Coffee (NO MILK/CREAM OR CREAMERS, sugar ok)  Clear Tea (NO MILK/CREAM OR CREAMERS, sugar ok) regular and decaf                             Plain Jell-O (NO RED)                                           Fruit ices (not with fruit pulp, NO RED)                                     Popsicles (NO RED)                                                               Sports drinks like Gatorade (NO RED)                   The day of surgery:  Drink ONE (1) Pre-Surgery Clear Ensure or G2 at  0700AM the morning of surgery. Drink in one sitting. Do not sip.  This drink was given to you during your hospital  pre-op appointment visit. Nothing else to drink after completing the   Pre-Surgery Clear Ensure or G2.          If you have questions, please contact your surgeon's office.        Oral Hygiene is also important to reduce your risk of infection.                                    Remember - BRUSH YOUR TEETH THE MORNING OF SURGERY WITH YOUR REGULAR TOOTHPASTE  DENTURES WILL BE  REMOVED PRIOR TO SURGERY PLEASE DO NOT APPLY "Poly grip" OR ADHESIVES!!!   Do NOT smoke after Midnight   Stop all vitamins and herbal supplements 7 days before surgery.   Take these medicines the morning of surgery with A SIP OF WATER: levothyroxine, cytomel             Metformin- none am of surgery    DO NOT TAKE ANY ORAL DIABETIC MEDICATIONS DAY OF YOUR SURGERY  Bring CPAP mask and tubing day of surgery.                              You may not have any metal on your body including hair pins, jewelry, and body piercing             Do not wear make-up, lotions, powders, perfumes/cologne, or deodorant  Do not wear nail polish including gel and S&S, artificial/acrylic nails, or any other type of covering on natural nails including finger and toenails. If you have artificial nails, gel coating, etc. that needs to be removed by a nail salon please have this removed prior to surgery or surgery may need to be canceled/ delayed if the surgeon/ anesthesia feels like they are unable to be safely monitored.   Do not shave  48 hours prior to surgery.               Men may shave face and neck.   Do not bring valuables to the hospital. [facility] IS NOT             RESPONSIBLE   FOR VALUABLES.   Contacts, glasses, dentures or bridgework may not be worn into surgery.   Bring small overnight bag day of surgery.   DO NOT BRING YOUR HOME MEDICATIONS TO THE HOSPITAL. PHARMACY WILL DISPENSE MEDICATIONS LISTED ON YOUR MEDICATION LIST TO YOU DURING YOUR ADMISSION IN THE HOSPITAL!    Patients discharged on the day of surgery will not be allowed to drive home.  Someone NEEDS to stay with  you for the first 24 hours after anesthesia.   Special Instructions: Bring a copy of your healthcare power of attorney and living will documents the day of surgery if you haven't scanned them before.              Please read over the following fact sheets you were given: IF YOU HAVE QUESTIONS ABOUT YOUR PRE-OP INSTRUCTIONS PLEASE CALL [phone]   If you received a COVID test during your pre-op visit  it is requested that you wear a mask when out in public, stay away from anyone that may not be feeling well and notify your surgeon if you develop symptoms. If you test positive for Covid or have been in contact with anyone that has tested positive in the last 10 days please notify you surgeon.    [facility] - Preparing for Surgery Before surgery, you can play an important role.  Because skin is not sterile, your skin needs to be as free of germs as possible.  You can reduce the number of germs on your skin by washing with CHG (chlorahexidine gluconate) soap before surgery.  CHG is an antiseptic cleaner which kills germs and bonds with the skin to continue killing germs even after washing. Please DO NOT use if you have an allergy to CHG or antibacterial soaps.  If your skin becomes reddened/irritated stop using the CHG and inform your nurse when  you arrive at Short Stay. Do not shave (including legs and underarms) for at least 48 hours prior to the first CHG shower.  You may shave your face/neck. Please follow these instructions carefully:  1.  Shower with CHG Soap the night before surgery and the  morning of Surgery.  2.  If you choose to wash your hair, wash your hair first as usual with your  normal  shampoo.  3.  After you shampoo, rinse your hair and body thoroughly to remove the  shampoo.                           4.  Use CHG as you would any other liquid soap.  You can apply chg directly  to the skin and wash                       Gently with a scrungie or clean washcloth.  5.  Apply the CHG  Soap to your body ONLY FROM THE NECK DOWN.   Do not use on face/ open                           Wound or open sores. Avoid contact with eyes, ears mouth and genitals (private parts).                       Wash face,  Genitals (private parts) with your normal soap.             6.  Wash thoroughly, paying special attention to the area where your surgery  will be performed.  7.  Thoroughly rinse your body with warm water from the neck down.  8.  DO NOT shower/wash with your normal soap after using and rinsing off  the CHG Soap.                9.  Pat yourself dry with a clean towel.            10.  Wear clean pajamas.            11.  Place clean sheets on your bed the night of your first shower and do not  sleep with pets. Day of Surgery : Do not apply any lotions/deodorants the morning of surgery.  Please wear clean clothes to the hospital/surgery center.  FAILURE TO FOLLOW THESE INSTRUCTIONS MAY RESULT IN THE CANCELLATION OF YOUR SURGERY PATIENT SIGNATURE_________________________________  NURSE SIGNATURE__________________________________  ________________________________________________________________________

## 2023-08-18 ENCOUNTER — Encounter (HOSPITAL_COMMUNITY): Payer: Self-pay

## 2023-08-18 ENCOUNTER — Encounter (HOSPITAL_COMMUNITY)
Admission: RE | Admit: 2023-08-18 | Discharge: 2023-08-18 | Disposition: A | Discharge status: Home / Self Care | Payer: Medicare Other | Source: Ambulatory Visit | Attending: Orthopedic Surgery | Admitting: Orthopedic Surgery

## 2023-08-18 ENCOUNTER — Other Ambulatory Visit: Payer: Self-pay

## 2023-08-18 DIAGNOSIS — E119 Type 2 diabetes mellitus without complications: Secondary | ICD-10-CM

## 2023-08-18 DIAGNOSIS — Z01818 Encounter for other preprocedural examination: Secondary | ICD-10-CM

## 2023-08-18 HISTORY — DX: Other specified postprocedural states: Z98.890

## 2023-08-18 HISTORY — DX: Malignant (primary) neoplasm, unspecified: C80.1

## 2023-08-18 HISTORY — DX: Hypothyroidism, unspecified: E03.9

## 2023-08-19 ENCOUNTER — Encounter (HOSPITAL_COMMUNITY): Payer: Self-pay | Admitting: Medical

## 2023-08-23 ENCOUNTER — Ambulatory Visit: Payer: Self-pay | Admitting: Emergency Medicine

## 2023-08-23 DIAGNOSIS — G8929 Other chronic pain: Secondary | ICD-10-CM

## 2023-08-23 DIAGNOSIS — S76012A Strain of muscle, fascia and tendon of left hip, initial encounter: Secondary | ICD-10-CM

## 2023-08-23 NOTE — H&P (Signed)
TOTAL HIP ADMISSION H&P  Patient is admitted for right adductor repair.  Subjective:  Chief Complaint: right hip pain  HPI: Karla Walker, 67 y.o. female, has a history of pain and functional disability in the right hip(s) due to  gluteus medius rupture  and patient has failed non-surgical conservative treatments such as NSAID's and/or analgesics, use of assistive devices, and activity modification.  Onset of symptoms was abrupt starting  less than  years ago with gradually worsening course since that time.The patient noted no past surgery on the right hip(s).  Patient currently rates pain in the right hip at 9 out of 10 with activity. Patient has night pain, worsening of pain with activity and weight bearing, pain that interfers with activities of daily living, and pain with passive range of motion. Patient has evidence of  gluteus medius rupture  by imaging studies. This condition presents safety issues increasing the risk of falls.  There is no current active infection.  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   Pes cavus 11/25/2011   Past Medical History:  Diagnosis Date   Cancer (HCC)    bladder ca, melanoma, squamous cell   Hypothyroidism    PONV (postoperative nausea and vomiting)     Past Surgical History:  Procedure Laterality Date   BREAST EXCISIONAL BIOPSY     BUNIONECTOMY     COLONOSCOPY WITH PROPOFOL N/A 11/30/2016   Procedure: COLONOSCOPY WITH PROPOFOL;  Surgeon: Charolett Bumpers, MD;  Location: [facility];  Service: Endoscopy;  Laterality: N/A;    Current Outpatient Medications  Medication Sig Dispense Refill Last Dose   hydrochlorothiazide (MICROZIDE) 12.5 MG capsule Take 12.5 mg by mouth daily.      Levothyroxine Sodium 75 MCG CAPS Take 75 mcg by mouth daily before breakfast.      liothyronine (CYTOMEL) 5 MCG tablet Take 10 mcg by mouth every morning.      MAGNESIUM PO Take 1,400 mg by mouth. Magnesium  7 350 mg each      metFORMIN (GLUCOPHAGE) 1000 MG tablet Take 1,000 mg by mouth at bedtime.      niacin (VITAMIN B3) 500 MG ER tablet Take 1,000 mg by mouth at bedtime.      NON FORMULARY Inject 1 each into the muscle See admin instructions. Testosterone 137.5 mg / Estradiol 12.5 mg - Insert 1 pellet in skin twice a year      progesterone (PROMETRIUM) 200 MG capsule Take 400 mg by mouth at bedtime.      Vitamins A D K (ADK PO) Take 1 tablet by mouth daily. Vitamin ADK 10      Zinc Sulfate (ZINC 15 PO) Take 15 mg by mouth at bedtime.      No current facility-administered medications for this visit.   No Known Allergies  Social History   Tobacco Use   Smoking status: Never   Smokeless tobacco: Never  Substance Use Topics   Alcohol use: Yes    Alcohol/week: 1.0 standard drink of alcohol    Types: 1 Glasses of wine per week    Comment: 2-3x per week    Family History  Problem Relation Age of Onset   Breast cancer Neg Hx      Review of Systems  Musculoskeletal:  Positive for arthralgias.  All other systems reviewed and are negative.   Objective:  Physical Exam Constitutional:      General: She is not in acute distress.  Appearance: Normal appearance. She is not ill-appearing.  HENT:     Head: Normocephalic and atraumatic.     Right Ear: External ear normal.     Left Ear: External ear normal.     Nose: Nose normal.     Mouth/Throat:     Mouth: Mucous membranes are moist.     Pharynx: Oropharynx is clear.  Eyes:     Extraocular Movements: Extraocular movements intact.     Conjunctiva/sclera: Conjunctivae normal.  Cardiovascular:     Rate and Rhythm: Normal rate and regular rhythm.     Pulses: Normal pulses.     Heart sounds: Normal heart sounds.  Pulmonary:     Effort: Pulmonary effort is normal.     Breath sounds: Normal breath sounds.  Abdominal:     General: Bowel sounds are normal.     Palpations: Abdomen is soft.     Tenderness: There is no abdominal  tenderness.  Musculoskeletal:        General: Tenderness present.     Cervical back: Normal range of motion and neck supple.     Comments: TTP over right lateral thigh and gluteal region.  No significant swelling.  No overlying lesions of area of chief complaint.  Decreased strength and ROM due to elicited pain.  Dorsiflexion and plantarflexion intact.  BLE appear grossly neurovascularly intact.  Gait mildly antalgic.   Skin:    General: Skin is warm and dry.  Neurological:     Mental Status: She is alert and oriented to person, place, and time. Mental status is at baseline.  Psychiatric:        Mood and Affect: Mood normal.        Behavior: Behavior normal.     Vital signs in last 24 hours: @VSRANGES @  Labs:   Estimated body mass index is 24.8 kg/m as calculated from the following:   Height as of 11/30/16: 5\' 3"  (1.6 m).   Weight as of 11/30/16: 63.5 kg.   Imaging Review MRI and Ultrasound imaging demonstrates gluteus medius rupture with 2 cm of retraction of the right hip(s). The bone quality appears to be good for age and reported activity level.     Assessment/Plan:  Gluteus medius tear, right hip(s)  The patient history, physical examination, clinical judgement of the provider and imaging studies are consistent with gluteus medius tear of the right hip(s) and adductor repair is deemed medically necessary. The treatment options including medical management, injection therapy, adductor repair were discussed at length. The risks and benefits of adductor repair were presented and reviewed. The risks due to aseptic loosening, infection, stiffness, repeat rupture, thromboembolic complications and other imponderables were discussed.  The patient acknowledged the explanation, agreed to proceed with the plan and consent was signed. Patient is being admitted for inpatient treatment for surgery, pain control, PT, OT, prophylactic antibiotics, VTE prophylaxis, progressive ambulation and  ADL's and discharge planning.The patient is planning to be discharged home with outpatient PT.    Patient's anticipated LOS is less than 2 midnights, meeting these requirements: - Younger than 53 - Lives within 1 hour of care - Has a competent adult at home to recover with post-op recover - NO history of  - Chronic pain requiring opiods  - Diabetes  - Coronary Artery Disease  - Heart failure  - Heart attack  - Stroke  - DVT/VTE  - Cardiac arrhythmia  - Respiratory Failure/COPD  - Renal failure  - Anemia  - Advanced Liver disease

## 2023-08-30 ENCOUNTER — Inpatient Hospital Stay (HOSPITAL_COMMUNITY): Admission: RE | Admit: 2023-08-30 | Payer: Medicare Other | Source: Ambulatory Visit | Admitting: Orthopedic Surgery

## 2023-08-30 ENCOUNTER — Encounter (HOSPITAL_COMMUNITY): Admission: RE | Payer: Self-pay | Source: Ambulatory Visit

## 2023-08-30 SURGERY — REPAIR, TENDON, GLUTEUS MEDIUS, OPEN
Anesthesia: Choice | Laterality: Right
